# Patient Record
Sex: Female | Born: 1960 | Race: White | Hispanic: No | Marital: Single | Smoking: Never smoker
Health system: Southern US, Community
[De-identification: ages and names within clinical notes are randomized; demographics above are authoritative.]

## PROBLEM LIST (undated history)

## (undated) DIAGNOSIS — E079 Disorder of thyroid, unspecified: Secondary | ICD-10-CM

## (undated) HISTORY — PX: ABDOMINAL HYSTERECTOMY: SHX81

## (undated) HISTORY — PX: HERNIA REPAIR: SHX51

## (undated) HISTORY — PX: THYROIDECTOMY: SHX17

## (undated) HISTORY — PX: KNEE SURGERY: SHX244

---

## 2020-04-09 ENCOUNTER — Emergency Department (HOSPITAL_BASED_OUTPATIENT_CLINIC_OR_DEPARTMENT_OTHER): Payer: BC Managed Care – PPO

## 2020-04-09 ENCOUNTER — Other Ambulatory Visit: Payer: Self-pay

## 2020-04-09 ENCOUNTER — Emergency Department (HOSPITAL_BASED_OUTPATIENT_CLINIC_OR_DEPARTMENT_OTHER)
Admission: EM | Admit: 2020-04-09 | Discharge: 2020-04-09 | Disposition: A | Discharge status: Home / Self Care | Payer: BC Managed Care – PPO | Attending: Emergency Medicine | Admitting: Emergency Medicine

## 2020-04-09 ENCOUNTER — Encounter (HOSPITAL_BASED_OUTPATIENT_CLINIC_OR_DEPARTMENT_OTHER): Payer: Self-pay | Admitting: *Deleted

## 2020-04-09 DIAGNOSIS — R2242 Localized swelling, mass and lump, left lower limb: Secondary | ICD-10-CM | POA: Diagnosis not present

## 2020-04-09 DIAGNOSIS — M7989 Other specified soft tissue disorders: Secondary | ICD-10-CM

## 2020-04-09 HISTORY — DX: Disorder of thyroid, unspecified: E07.9

## 2020-04-09 MED ORDER — ENOXAPARIN SODIUM 100 MG/ML ~~LOC~~ SOLN
1.0000 mg/kg | Freq: Once | SUBCUTANEOUS | Status: AC
  Administered 2020-04-09: 102.5 mg via SUBCUTANEOUS
  Filled 2020-04-09: qty 2

## 2020-04-09 NOTE — ED Notes (Signed)
ED Provider at bedside. 

## 2020-04-09 NOTE — Discharge Instructions (Addendum)
Do not take your diclofenac medicine tonight or tomorrow morning.  This is the anti-inflammatory.  Continue your other medications.  Elevate your leg when you get home today.   You were given a dose of blood thinner today in case you have a blood clot. Please return tomorrow for your scheduled ultrasound.  Return to the emergency department for any new or worsening symptoms.   We hope you feel better.  Thank you for allowing [place] to care for you!

## 2020-04-09 NOTE — ED Provider Notes (Signed)
[facility] EMERGENCY DEPARTMENT Provider Note   CSN: [id number] Arrival date & time: 04/09/20  1507     History Chief Complaint  Patient presents with  . Leg Swelling    Jeanette Downs is a 59 y.o. female with past medical history significant for hypothyroidism.  HPI Presents to emergency department today with chief complaint of left leg swelling.  Patient underwent knee replacement in [place] 04/03/20.  She flew to [place] x2 days later to stay with her sister who was helping her with the rehab.  Patient had her first PT appointment today.  The therapist noticed that her leg was swollen.  The patient said she had a mild pain in her leg and was advised to come to the emergency department to have an ultrasound to make sure there is no blood clot.  Patient states the swelling is similar to what it has been, she does not think it looks worse today.  She does have bruising from her groin to her ankle that is also looking unchanged per patient.  She denies any new fall or injury to the leg.  She has been wearing a compression hose daily only taking it off while sleeping at night.  She states she has mild numbness to the palpation of her left knee.  Denies any fever, chills, cough, hemoptysis, shortness of breath, chest pain, palpitations, syncope, lower extremity numbness or weakness.  Denies history of blood clots.  Not on anticoagulation.    Past Medical History:  Diagnosis Date  . Thyroid disease     There are no problems to display for this patient.   Past Surgical History:  Procedure Laterality Date  . ABDOMINAL HYSTERECTOMY    . CESAREAN SECTION    . HERNIA REPAIR    . KNEE SURGERY    . THYROIDECTOMY       OB History   No obstetric history on file.     No family history on file.  Social History   Tobacco Use  . Smoking status: Never Smoker  . Smokeless tobacco: Never Used  Substance Use Topics  . Alcohol use: Yes  . Drug use: Never    Home  Medications Prior to Admission medications   Medication Sig Start Date End Date Taking? Authorizing Provider  aspirin EC 81 MG tablet Take 81 mg by mouth daily. Swallow whole.   Yes [provider]  Diclofenac Sodium (VOLTAREN PO) Take by mouth.   Yes [provider]  DOXYCYCLINE PO Take by mouth.   Yes [provider]  HYDROcodone-Acetaminophen (NORCO PO) Take by mouth.   Yes [provider]  Levothyroxine Sodium (SYNTHROID PO) Take by mouth.   Yes [provider]  Ondansetron HCl (ZOFRAN PO) Take by mouth.   Yes [provider]  Pantoprazole Sodium (PROTONIX PO) Take by mouth.   Yes [provider]  Pregabalin (LYRICA PO) Take by mouth.   Yes [provider]  pseudoephedrine-acetaminophen (TYLENOL SINUS) 30-500 MG TABS tablet Take 1 tablet by mouth every 4 (four) hours as needed.   Yes [provider]  scopolamine (TRANSDERM-SCOP) 1 MG/3DAYS Place 1 patch onto the skin every 3 (three) days.   Yes [provider]    Allergies    Patient has no known allergies.  Review of Systems   Review of Systems All other systems are reviewed and are negative for acute change except as noted in the HPI.  Physical Exam Updated Vital Signs BP 134/61 (BP Location:  Left Arm)   Pulse 78   Temp 98.5 F (36.9 C) (Oral)   Resp 16   Ht 5\' 7"  (1.702 m)   Wt 102.1 kg   SpO2 96%   BMI 35.24 kg/m   Physical Exam Vitals and nursing note reviewed.  Constitutional:      Appearance: She is well-developed. She is not ill-appearing or toxic-appearing.  HENT:     Head: Normocephalic and atraumatic.     Nose: Nose normal.  Eyes:     General: No scleral icterus.       Right eye: No discharge.        Left eye: No discharge.     Conjunctiva/sclera: Conjunctivae normal.  Neck:     Vascular: No JVD.  Cardiovascular:     Rate and Rhythm: Normal rate and regular rhythm.     Pulses: Normal pulses.          Dorsalis  pedis pulses are 2+ on the right side and 2+ on the left side.     Heart sounds: Normal heart sounds.  Pulmonary:     Effort: Pulmonary effort is normal.     Breath sounds: Normal breath sounds.  Abdominal:     General: There is no distension.  Musculoskeletal:        General: Normal range of motion.     Cervical back: Normal range of motion.     Left lower leg: Edema present.     Comments: No palpable cord on palpation of left calf.  There is swelling of left lower extremity compared to right lower extremity.  Compartments of left lower extremity are soft.  Incision over left patella is clean dry and intact.  No bleeding or purulent drainage.  Ecchymosis from medial thigh extending to medial ankle unchanged per patient.  Equal tactile temperature in bilateral lower extremities.  Skin:    General: Skin is warm and dry.  Neurological:     Mental Status: She is oriented to person, place, and time.     GCS: GCS eye subscore is 4. GCS verbal subscore is 5. GCS motor subscore is 6.     Comments: Fluent speech, no facial droop.  Psychiatric:        Behavior: Behavior normal.     ED Results / Procedures / Treatments   Labs (all labs ordered are listed, but only abnormal results are displayed) Labs Reviewed - No data to display  EKG None  Radiology No results found.  Procedures Procedures (including critical care time)  Medications Ordered in ED Medications  enoxaparin (LOVENOX) injection 102.5 mg (has no administration in time range)    ED Course  I have reviewed the triage vital signs and the nursing notes.  Pertinent labs & imaging results that were available during my care of the patient were reviewed by me and considered in my medical decision making (see chart for details).    MDM Rules/Calculators/A&P                          History provided by patient with additional history obtained from chart review.     59 yo female presenting with left leg swelling after  recent knee replacement and flight from [place].  She is not currently on anticoagulation.  Vital signs are stable.  No tachycardia or hypoxia.  On exam DP pulse 2+ bilaterally.  She does have edema noted to left lower extremity.  Compartments are soft.  Incision  is well-appearing, sutures are intact.  No signs of infection.   Care everywhere shows patient had normal kidney function on labs from approximately 1 month ago.  Unfortunately ultrasound is unavailable in this facility at this time. Bedside US performed by ED attending Dr. Stevie Kern and is without obvious DVT or signs of cellulitis.  Will advise patient to hold anti-inflammatory until DVT study.  Patient given dose of Lovenox to cover for possible DVT.  Patient scheduled to return tomorrow morning for DVT study.  The patient appears reasonably screened and/or stabilized for discharge and I doubt any other medical condition or other [facility] requiring further screening, evaluation, or treatment in the ED at this time prior to discharge. The patient is safe for discharge with strict return precautions discussed.    Portions of this note were generated with Scientist, clinical (histocompatibility and immunogenetics). Dictation errors may occur despite best attempts at proofreading.  Final Clinical Impression(s) / ED Diagnoses Final diagnoses:  Leg swelling    Rx / DC Orders ED Discharge Orders         Ordered    US Venous Img Lower Unilateral Left        04/09/20 1837           Sherene Sires, PA-C 04/09/20 1846    Milagros Loll, MD 04/10/20 [phone]

## 2020-04-09 NOTE — ED Triage Notes (Signed)
She had surgery on her left knee 7 days ago. She flew from [place] for rehab to be close to family. Here today with swelling to her left lower leg.

## 2020-04-10 ENCOUNTER — Ambulatory Visit (HOSPITAL_BASED_OUTPATIENT_CLINIC_OR_DEPARTMENT_OTHER)
Admission: RE | Admit: 2020-04-10 | Discharge: 2020-04-10 | Disposition: A | Discharge status: Home / Self Care | Payer: BC Managed Care – PPO | Source: Ambulatory Visit | Attending: Physician Assistant | Admitting: Physician Assistant

## 2020-04-10 DIAGNOSIS — Z96652 Presence of left artificial knee joint: Secondary | ICD-10-CM | POA: Insufficient documentation

## 2020-04-10 DIAGNOSIS — M79605 Pain in left leg: Secondary | ICD-10-CM | POA: Diagnosis not present

## 2020-04-10 DIAGNOSIS — M7989 Other specified soft tissue disorders: Secondary | ICD-10-CM | POA: Diagnosis not present

## 2020-04-10 NOTE — ED Provider Notes (Signed)
Ultrasound ED DVT  Date/Time: 04/10/2020 4:19 PM Performed by: Milagros Loll, MD Authorized by: Milagros Loll, MD   Procedure details:    Indications: lower extremity pain     Assessment for:  DVT   Images Archived: Yes   LLE Findings:    Left common femoral vein:  Compressible   Left deep and superficial femoral veins:  Compressible   Left popliteal vein:  Compressible IMPRESSION:   DVT:     None      Milagros Loll, MD 04/10/20 [phone]

## 2020-04-10 NOTE — ED Provider Notes (Signed)
I saw patient yesterday for lower extremity edema. Patient returned today for DVT study. It is negative. I informed patient of results. She has no further questions. Strict return precautions discussed.   No results found for this or any previous visit. US Venous Img Lower Unilateral Left  Result Date: 04/10/2020 CLINICAL DATA:  Left total knee replacement, left leg pain and swelling EXAM: LEFT LOWER EXTREMITY VENOUS DOPPLER ULTRASOUND TECHNIQUE: Gray-scale sonography with graded compression, as well as color Doppler and duplex ultrasound were performed to evaluate the lower extremity deep venous systems from the level of the common femoral vein and including the common femoral, femoral, profunda femoral, popliteal and calf veins including the posterior tibial, peroneal and gastrocnemius veins when visible. The superficial great saphenous vein was also interrogated. Spectral Doppler was utilized to evaluate flow at rest and with distal augmentation maneuvers in the common femoral, femoral and popliteal veins. COMPARISON:  None. FINDINGS: Contralateral Common Femoral Vein: Respiratory phasicity is normal and symmetric with the symptomatic side. No evidence of thrombus. Normal compressibility. Common Femoral Vein: No evidence of thrombus. Normal compressibility, respiratory phasicity and response to augmentation. Saphenofemoral Junction: No evidence of thrombus. Normal compressibility and flow on color Doppler imaging. Profunda Femoral Vein: No evidence of thrombus. Normal compressibility and flow on color Doppler imaging. Femoral Vein: No evidence of thrombus. Normal compressibility, respiratory phasicity and response to augmentation. Popliteal Vein: No evidence of thrombus. Normal compressibility, respiratory phasicity and response to augmentation. Calf Veins: Limited assessment of the calf veins because of body habitus and peripheral edema. No large occlusive thrombus appreciated. IMPRESSION: No significant  left lower extremity DVT. Limited assessment of the calf veins. Electronically Signed   By: Judie Petit.  Shick M.D.   On: 04/10/2020 13:28      Sherene Sires, PA-C 04/10/20 1344    Pricilla Loveless, MD 04/10/20 1524

## 2021-06-03 IMAGING — US US EXTREM LOW VENOUS*L*
1 series · 13 of 24 positions shown · non-contrast
Comparison: None.

CLINICAL DATA: Left total knee replacement, left leg pain and
swelling



[Series 1: us extrem low venous*left* · 13 of 28 slices shown]
[im 1/28]
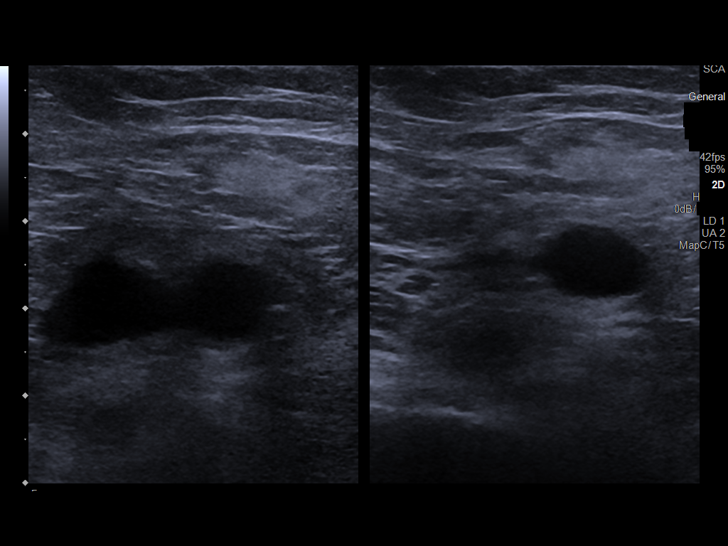
[im 3/28]
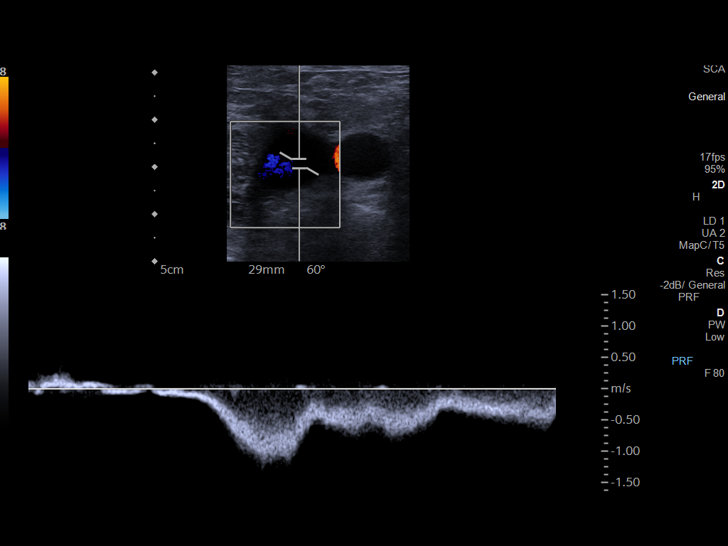
[im 5/28]
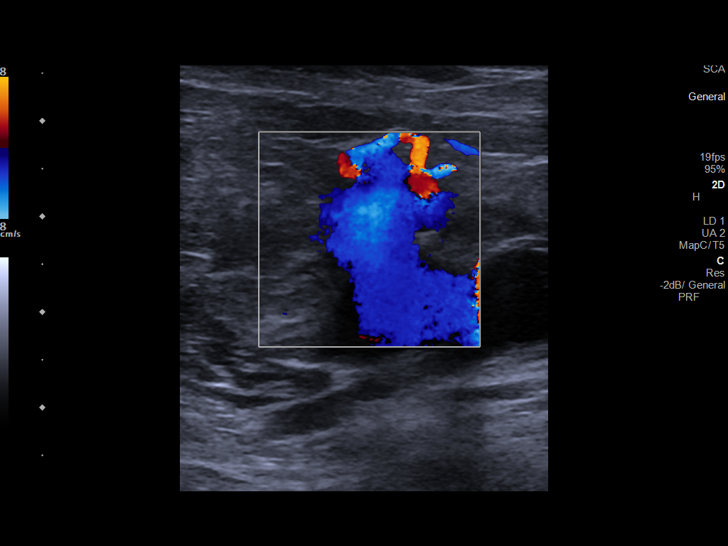
[im 8/28]
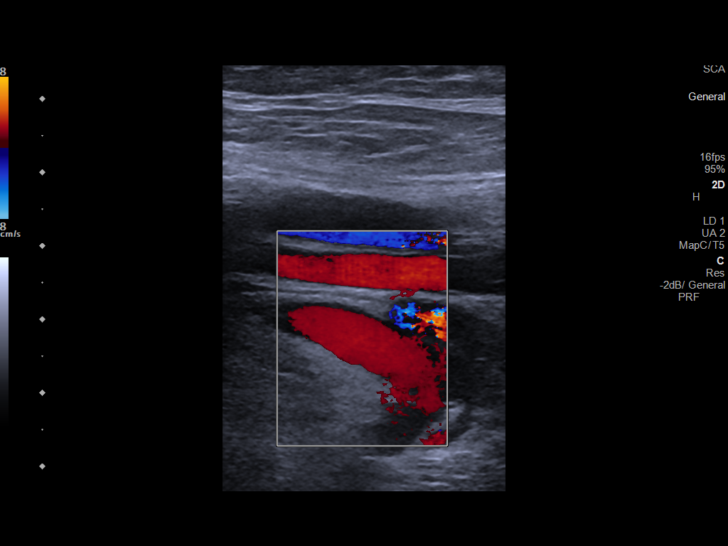
[im 10/28]
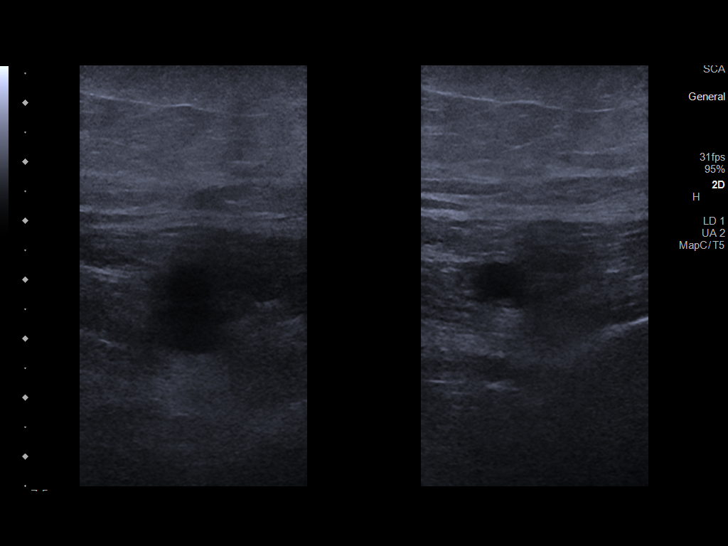
[im 12/28]
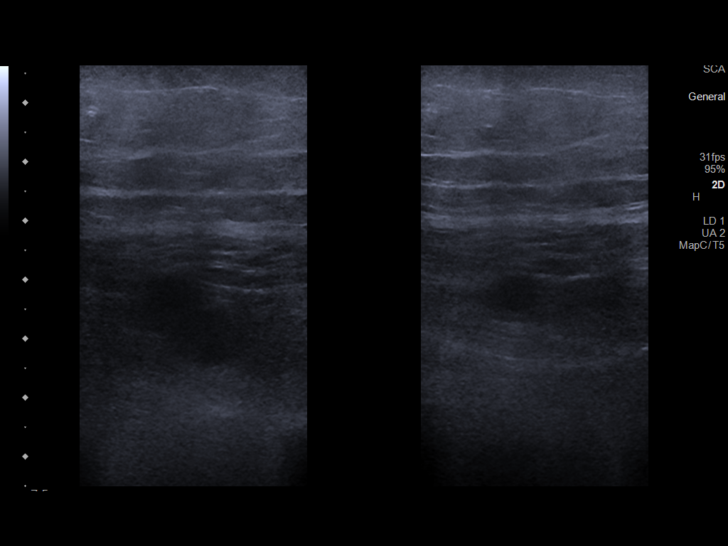
[im 15/28]
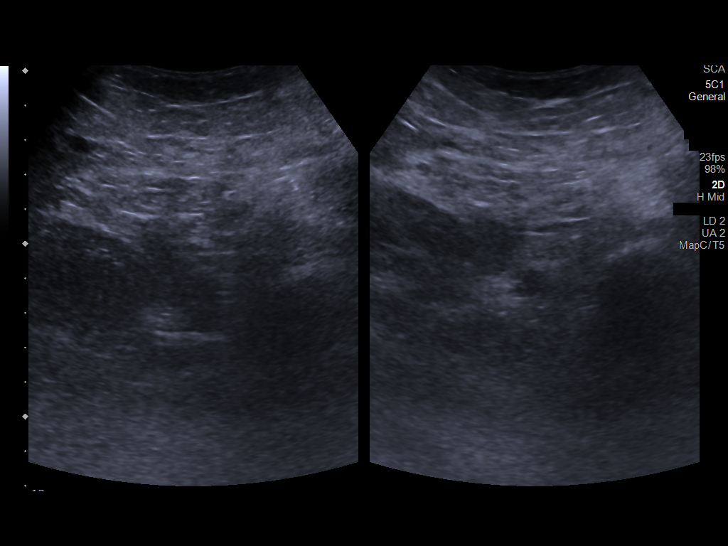
[im 16/28]
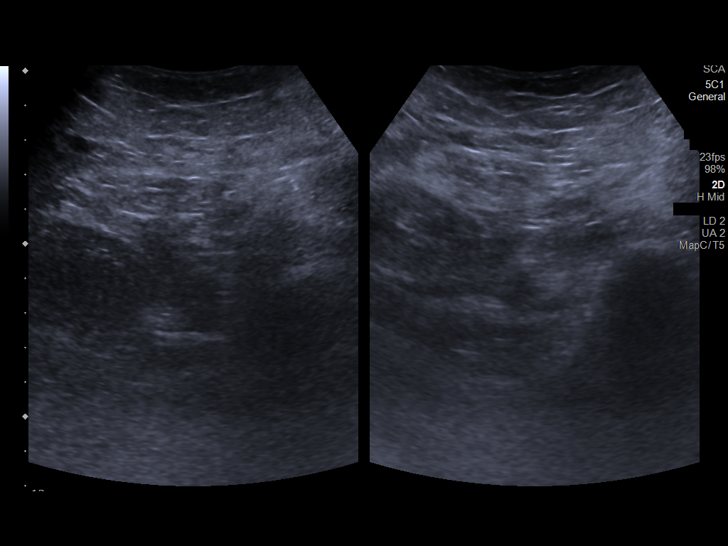
[im 18/28]
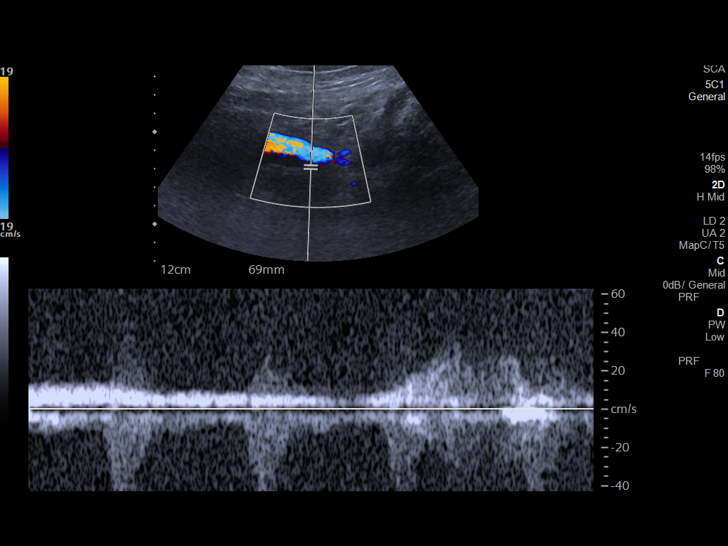
[im 20/28]
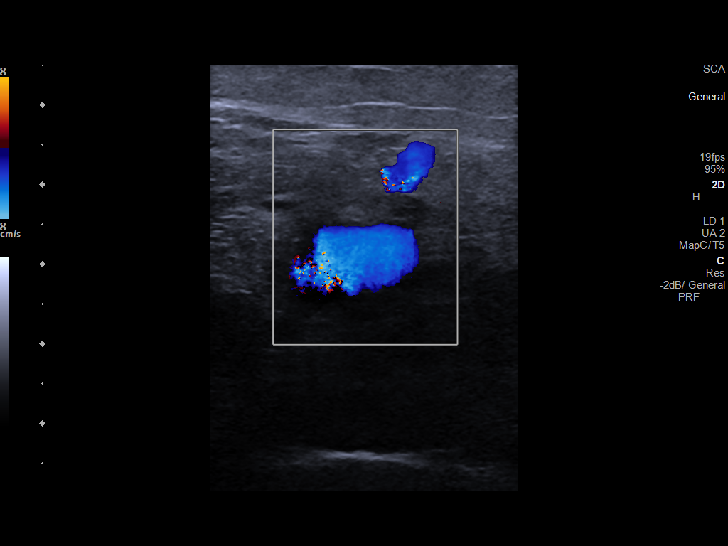
[im 23/28]
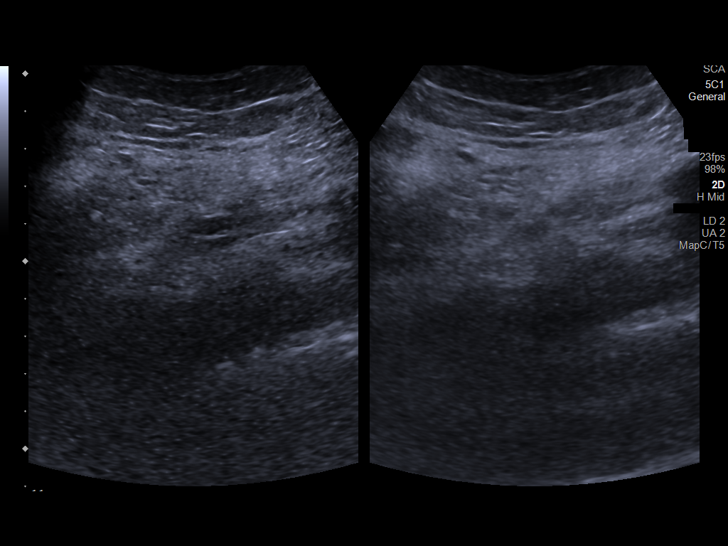
[im 25/28]
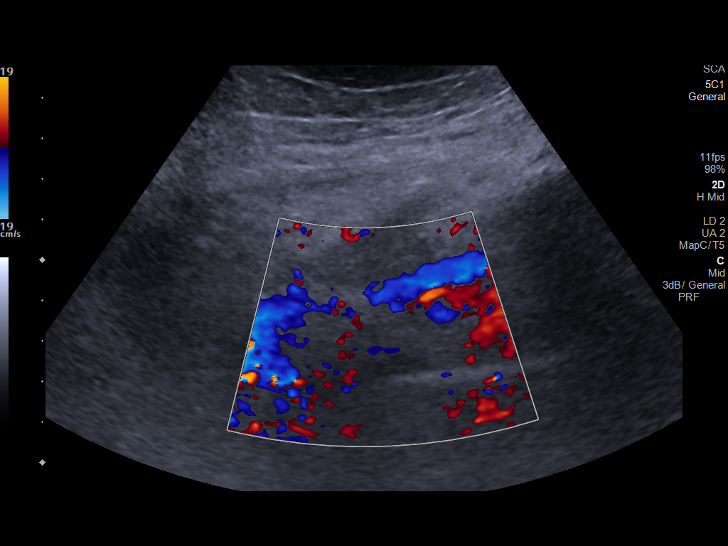
[im 28/28]
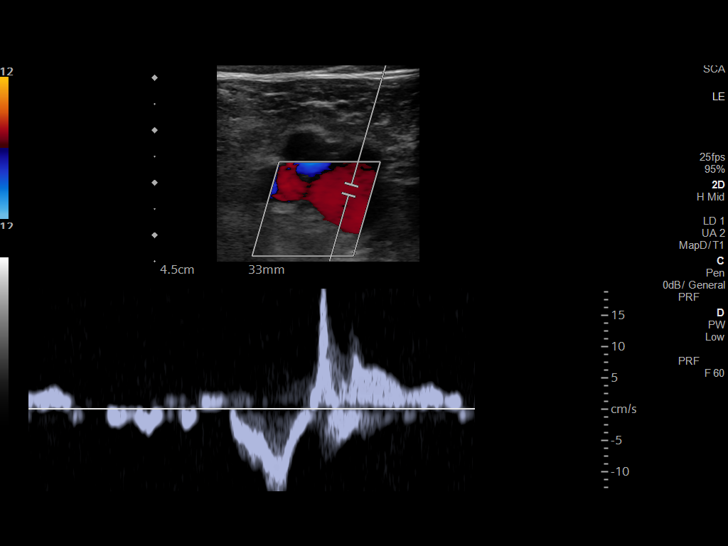

[13 of 24 positions shown; findings below may reference images not displayed]

FINDINGS: Contralateral Common Femoral Vein: Respiratory phasicity is normal
and symmetric with the symptomatic side. No evidence of thrombus.
Normal compressibility.

Common Femoral Vein: No evidence of thrombus. Normal
compressibility, respiratory phasicity and response to augmentation.

Saphenofemoral Junction: No evidence of thrombus. Normal
compressibility and flow on color Doppler imaging.

Profunda Femoral Vein: No evidence of thrombus. Normal
compressibility and flow on color Doppler imaging.

Femoral Vein: No evidence of thrombus. Normal compressibility,
respiratory phasicity and response to augmentation.

Popliteal Vein: No evidence of thrombus. Normal compressibility,
respiratory phasicity and response to augmentation.

Calf Veins: Limited assessment of the calf veins because of body
habitus and peripheral edema. No large occlusive thrombus
appreciated.
IMPRESSION: No significant left lower extremity DVT. Limited assessment of the
calf veins.
